# Patient Record
Sex: Female | Born: 1965
Health system: Southern US, Community
[De-identification: ages and names within clinical notes are randomized; demographics above are authoritative.]

---

## 1998-05-10 ENCOUNTER — Other Ambulatory Visit: Admission: RE | Admit: 1998-05-10 | Discharge: 1998-05-10 | Payer: Self-pay | Admitting: Gynecology

## 1998-06-17 ENCOUNTER — Ambulatory Visit: Admission: RE | Admit: 1998-06-17 | Discharge: 1998-06-17 | Payer: Self-pay | Admitting: Obstetrics and Gynecology

## 1999-08-04 ENCOUNTER — Inpatient Hospital Stay (HOSPITAL_COMMUNITY): Admission: AD | Admit: 1999-08-04 | Discharge: 1999-08-08 | Payer: Self-pay | Admitting: Obstetrics and Gynecology

## 1999-09-04 ENCOUNTER — Other Ambulatory Visit: Admission: RE | Admit: 1999-09-04 | Discharge: 1999-09-04 | Payer: Self-pay | Admitting: Obstetrics & Gynecology

## 2000-12-10 ENCOUNTER — Other Ambulatory Visit: Admission: RE | Admit: 2000-12-10 | Discharge: 2000-12-10 | Payer: Self-pay | Admitting: Obstetrics and Gynecology

## 2000-12-12 ENCOUNTER — Encounter: Payer: Self-pay | Admitting: Family Medicine

## 2000-12-12 ENCOUNTER — Ambulatory Visit (HOSPITAL_COMMUNITY): Admission: RE | Admit: 2000-12-12 | Discharge: 2000-12-12 | Payer: Self-pay | Admitting: Family Medicine

## 2001-01-24 ENCOUNTER — Encounter: Payer: Self-pay | Admitting: *Deleted

## 2001-01-24 ENCOUNTER — Ambulatory Visit (HOSPITAL_COMMUNITY): Admission: RE | Admit: 2001-01-24 | Discharge: 2001-01-24 | Payer: Self-pay | Admitting: *Deleted

## 2001-02-06 ENCOUNTER — Ambulatory Visit (HOSPITAL_COMMUNITY): Admission: RE | Admit: 2001-02-06 | Discharge: 2001-02-06 | Payer: Self-pay | Admitting: *Deleted

## 2001-02-19 ENCOUNTER — Encounter: Payer: Self-pay | Admitting: Surgery

## 2001-02-19 ENCOUNTER — Observation Stay (HOSPITAL_COMMUNITY): Admission: RE | Admit: 2001-02-19 | Discharge: 2001-02-20 | Payer: Self-pay | Admitting: Surgery

## 2002-08-14 ENCOUNTER — Other Ambulatory Visit: Admission: RE | Admit: 2002-08-14 | Discharge: 2002-08-14 | Payer: Self-pay | Admitting: Obstetrics and Gynecology

## 2003-01-21 ENCOUNTER — Ambulatory Visit (HOSPITAL_COMMUNITY): Admission: RE | Admit: 2003-01-21 | Discharge: 2003-01-21 | Payer: Self-pay | Admitting: *Deleted

## 2003-01-21 ENCOUNTER — Encounter: Payer: Self-pay | Admitting: *Deleted

## 2003-10-28 ENCOUNTER — Other Ambulatory Visit: Admission: RE | Admit: 2003-10-28 | Discharge: 2003-10-28 | Payer: Self-pay | Admitting: Obstetrics and Gynecology

## 2004-12-07 ENCOUNTER — Other Ambulatory Visit: Admission: RE | Admit: 2004-12-07 | Discharge: 2004-12-07 | Payer: Self-pay | Admitting: Obstetrics and Gynecology

## 2005-12-21 ENCOUNTER — Other Ambulatory Visit: Admission: RE | Admit: 2005-12-21 | Discharge: 2005-12-21 | Payer: Self-pay | Admitting: Obstetrics and Gynecology

## 2012-02-05 ENCOUNTER — Other Ambulatory Visit: Payer: Self-pay | Admitting: Radiology

## 2012-04-11 ENCOUNTER — Other Ambulatory Visit: Payer: Self-pay | Admitting: Obstetrics and Gynecology

## 2013-06-26 ENCOUNTER — Other Ambulatory Visit: Payer: Self-pay | Admitting: Obstetrics and Gynecology

## 2014-07-02 ENCOUNTER — Other Ambulatory Visit: Payer: Self-pay | Admitting: Obstetrics and Gynecology

## 2014-07-05 LAB — CYTOLOGY - PAP

## 2014-09-06 ENCOUNTER — Other Ambulatory Visit: Payer: Self-pay | Admitting: Gastroenterology

## 2015-02-08 MED ORDER — FENTANYL CITRATE (PF) 100 MCG/2ML IJ SOLN
INTRAMUSCULAR | Status: AC
  Filled 2015-02-08: qty 6

## 2015-02-08 MED ORDER — MIDAZOLAM HCL 2 MG/2ML IJ SOLN
INTRAMUSCULAR | Status: AC
  Filled 2015-02-08: qty 2

## 2015-03-02 ENCOUNTER — Ambulatory Visit (HOSPITAL_BASED_OUTPATIENT_CLINIC_OR_DEPARTMENT_OTHER): Admission: RE | Admit: 2015-03-02 | Payer: BLUE CROSS/BLUE SHIELD | Source: Ambulatory Visit | Admitting: Surgery

## 2015-03-02 ENCOUNTER — Encounter (HOSPITAL_BASED_OUTPATIENT_CLINIC_OR_DEPARTMENT_OTHER): Admission: RE | Payer: Self-pay | Source: Ambulatory Visit

## 2015-03-02 SURGERY — CYST REMOVAL
Anesthesia: Choice | Site: Groin | Laterality: Right

## 2015-12-02 ENCOUNTER — Other Ambulatory Visit: Payer: Self-pay | Admitting: Obstetrics and Gynecology

## 2015-12-05 LAB — CYTOLOGY - PAP

## 2016-02-14 DIAGNOSIS — F4323 Adjustment disorder with mixed anxiety and depressed mood: Secondary | ICD-10-CM | POA: Diagnosis not present

## 2016-02-19 DIAGNOSIS — S0501XA Injury of conjunctiva and corneal abrasion without foreign body, right eye, initial encounter: Secondary | ICD-10-CM | POA: Diagnosis not present

## 2016-03-23 DIAGNOSIS — D1801 Hemangioma of skin and subcutaneous tissue: Secondary | ICD-10-CM | POA: Diagnosis not present

## 2016-03-23 DIAGNOSIS — Z85828 Personal history of other malignant neoplasm of skin: Secondary | ICD-10-CM | POA: Diagnosis not present

## 2016-03-23 DIAGNOSIS — D2371 Other benign neoplasm of skin of right lower limb, including hip: Secondary | ICD-10-CM | POA: Diagnosis not present

## 2016-03-23 DIAGNOSIS — D485 Neoplasm of uncertain behavior of skin: Secondary | ICD-10-CM | POA: Diagnosis not present

## 2016-03-23 DIAGNOSIS — L814 Other melanin hyperpigmentation: Secondary | ICD-10-CM | POA: Diagnosis not present

## 2016-03-23 DIAGNOSIS — L821 Other seborrheic keratosis: Secondary | ICD-10-CM | POA: Diagnosis not present

## 2016-03-23 DIAGNOSIS — C44722 Squamous cell carcinoma of skin of right lower limb, including hip: Secondary | ICD-10-CM | POA: Diagnosis not present

## 2016-05-01 DIAGNOSIS — M7711 Lateral epicondylitis, right elbow: Secondary | ICD-10-CM | POA: Diagnosis not present

## 2016-05-01 DIAGNOSIS — Z13220 Encounter for screening for lipoid disorders: Secondary | ICD-10-CM | POA: Diagnosis not present

## 2016-05-01 DIAGNOSIS — L298 Other pruritus: Secondary | ICD-10-CM | POA: Diagnosis not present

## 2016-05-01 DIAGNOSIS — Z0000 Encounter for general adult medical examination without abnormal findings: Secondary | ICD-10-CM | POA: Diagnosis not present

## 2016-05-01 DIAGNOSIS — F5101 Primary insomnia: Secondary | ICD-10-CM | POA: Diagnosis not present

## 2016-05-29 DIAGNOSIS — S53401A Unspecified sprain of right elbow, initial encounter: Secondary | ICD-10-CM | POA: Diagnosis not present

## 2016-05-29 DIAGNOSIS — M7710 Lateral epicondylitis, unspecified elbow: Secondary | ICD-10-CM | POA: Diagnosis not present

## 2016-05-29 DIAGNOSIS — M7700 Medial epicondylitis, unspecified elbow: Secondary | ICD-10-CM | POA: Diagnosis not present

## 2016-05-30 DIAGNOSIS — L905 Scar conditions and fibrosis of skin: Secondary | ICD-10-CM | POA: Diagnosis not present

## 2016-05-30 DIAGNOSIS — C44722 Squamous cell carcinoma of skin of right lower limb, including hip: Secondary | ICD-10-CM | POA: Diagnosis not present

## 2016-06-20 DIAGNOSIS — M7711 Lateral epicondylitis, right elbow: Secondary | ICD-10-CM | POA: Diagnosis not present

## 2016-08-01 DIAGNOSIS — M7711 Lateral epicondylitis, right elbow: Secondary | ICD-10-CM | POA: Diagnosis not present

## 2016-09-19 DIAGNOSIS — Z803 Family history of malignant neoplasm of breast: Secondary | ICD-10-CM | POA: Diagnosis not present

## 2016-09-19 DIAGNOSIS — Z1231 Encounter for screening mammogram for malignant neoplasm of breast: Secondary | ICD-10-CM | POA: Diagnosis not present

## 2016-10-16 DIAGNOSIS — M7711 Lateral epicondylitis, right elbow: Secondary | ICD-10-CM | POA: Diagnosis not present

## 2016-10-25 DIAGNOSIS — M7711 Lateral epicondylitis, right elbow: Secondary | ICD-10-CM | POA: Diagnosis not present

## 2016-10-29 DIAGNOSIS — M7711 Lateral epicondylitis, right elbow: Secondary | ICD-10-CM | POA: Diagnosis not present

## 2016-11-01 DIAGNOSIS — M7711 Lateral epicondylitis, right elbow: Secondary | ICD-10-CM | POA: Diagnosis not present

## 2016-11-05 DIAGNOSIS — M7711 Lateral epicondylitis, right elbow: Secondary | ICD-10-CM | POA: Diagnosis not present

## 2016-11-09 DIAGNOSIS — M7711 Lateral epicondylitis, right elbow: Secondary | ICD-10-CM | POA: Diagnosis not present

## 2016-11-12 DIAGNOSIS — M7711 Lateral epicondylitis, right elbow: Secondary | ICD-10-CM | POA: Diagnosis not present

## 2016-11-19 DIAGNOSIS — M7711 Lateral epicondylitis, right elbow: Secondary | ICD-10-CM | POA: Diagnosis not present

## 2016-11-23 DIAGNOSIS — M7711 Lateral epicondylitis, right elbow: Secondary | ICD-10-CM | POA: Diagnosis not present

## 2016-11-26 DIAGNOSIS — M7711 Lateral epicondylitis, right elbow: Secondary | ICD-10-CM | POA: Diagnosis not present

## 2017-03-14 DIAGNOSIS — R87612 Low grade squamous intraepithelial lesion on cytologic smear of cervix (LGSIL): Secondary | ICD-10-CM | POA: Diagnosis not present

## 2017-03-14 DIAGNOSIS — Z01419 Encounter for gynecological examination (general) (routine) without abnormal findings: Secondary | ICD-10-CM | POA: Diagnosis not present

## 2017-03-14 DIAGNOSIS — Z124 Encounter for screening for malignant neoplasm of cervix: Secondary | ICD-10-CM | POA: Diagnosis not present

## 2017-03-14 DIAGNOSIS — Z6826 Body mass index (BMI) 26.0-26.9, adult: Secondary | ICD-10-CM | POA: Diagnosis not present

## 2017-04-03 DIAGNOSIS — L820 Inflamed seborrheic keratosis: Secondary | ICD-10-CM | POA: Diagnosis not present

## 2017-04-03 DIAGNOSIS — D1800 Hemangioma unspecified site: Secondary | ICD-10-CM | POA: Diagnosis not present

## 2017-04-03 DIAGNOSIS — L814 Other melanin hyperpigmentation: Secondary | ICD-10-CM | POA: Diagnosis not present

## 2017-04-03 DIAGNOSIS — D225 Melanocytic nevi of trunk: Secondary | ICD-10-CM | POA: Diagnosis not present

## 2017-04-03 DIAGNOSIS — L821 Other seborrheic keratosis: Secondary | ICD-10-CM | POA: Diagnosis not present

## 2017-04-15 DIAGNOSIS — R350 Frequency of micturition: Secondary | ICD-10-CM | POA: Diagnosis not present

## 2017-04-15 DIAGNOSIS — S91312A Laceration without foreign body, left foot, initial encounter: Secondary | ICD-10-CM | POA: Diagnosis not present

## 2017-04-19 DIAGNOSIS — M7711 Lateral epicondylitis, right elbow: Secondary | ICD-10-CM | POA: Diagnosis not present

## 2017-04-22 DIAGNOSIS — Z6826 Body mass index (BMI) 26.0-26.9, adult: Secondary | ICD-10-CM | POA: Diagnosis not present

## 2017-04-22 DIAGNOSIS — Z3202 Encounter for pregnancy test, result negative: Secondary | ICD-10-CM | POA: Diagnosis not present

## 2017-04-22 DIAGNOSIS — R87612 Low grade squamous intraepithelial lesion on cytologic smear of cervix (LGSIL): Secondary | ICD-10-CM | POA: Diagnosis not present

## 2017-04-23 DIAGNOSIS — S91312D Laceration without foreign body, left foot, subsequent encounter: Secondary | ICD-10-CM | POA: Diagnosis not present

## 2017-06-20 DIAGNOSIS — M7711 Lateral epicondylitis, right elbow: Secondary | ICD-10-CM | POA: Diagnosis not present

## 2017-07-04 DIAGNOSIS — M7711 Lateral epicondylitis, right elbow: Secondary | ICD-10-CM | POA: Diagnosis not present

## 2017-07-26 DIAGNOSIS — Z23 Encounter for immunization: Secondary | ICD-10-CM | POA: Diagnosis not present

## 2017-07-26 DIAGNOSIS — Z0000 Encounter for general adult medical examination without abnormal findings: Secondary | ICD-10-CM | POA: Diagnosis not present

## 2017-07-26 DIAGNOSIS — Z13220 Encounter for screening for lipoid disorders: Secondary | ICD-10-CM | POA: Diagnosis not present

## 2017-08-19 DIAGNOSIS — M7711 Lateral epicondylitis, right elbow: Secondary | ICD-10-CM | POA: Diagnosis not present

## 2017-09-06 ENCOUNTER — Ambulatory Visit (HOSPITAL_COMMUNITY)
Admission: EM | Admit: 2017-09-06 | Discharge: 2017-09-06 | Disposition: A | Discharge status: Home / Self Care | Payer: BLUE CROSS/BLUE SHIELD | Attending: Internal Medicine | Admitting: Internal Medicine

## 2017-09-06 ENCOUNTER — Encounter (HOSPITAL_COMMUNITY): Payer: Self-pay | Admitting: Emergency Medicine

## 2017-09-06 DIAGNOSIS — M545 Low back pain, unspecified: Secondary | ICD-10-CM

## 2017-09-06 DIAGNOSIS — T148XXA Other injury of unspecified body region, initial encounter: Secondary | ICD-10-CM

## 2017-09-06 DIAGNOSIS — H10212 Acute toxic conjunctivitis, left eye: Secondary | ICD-10-CM | POA: Diagnosis not present

## 2017-09-06 LAB — POCT URINALYSIS DIP (DEVICE)
Bilirubin Urine: NEGATIVE
Glucose, UA: NEGATIVE mg/dL
Ketones, ur: NEGATIVE mg/dL
Leukocytes, UA: NEGATIVE
Nitrite: NEGATIVE
Protein, ur: NEGATIVE mg/dL
Specific Gravity, Urine: 1.015 (ref 1.005–1.030)
Urobilinogen, UA: 0.2 mg/dL (ref 0.0–1.0)
pH: 6.5 (ref 5.0–8.0)

## 2017-09-06 MED ORDER — POLYMYXIN B-TRIMETHOPRIM 10000-0.1 UNIT/ML-% OP SOLN: 1.0000 [drp] | OPHTHALMIC | 0 refills | Status: AC

## 2017-09-06 NOTE — Discharge Instructions (Signed)
Obtain Zaditor and place 1 drop in left eye twice a day. Heat and stretches to back

## 2017-09-06 NOTE — ED Triage Notes (Signed)
PT C/O: lower left back pain   ONSET: 5 days  SX ALSO INCLUDE: pain increases w/activity , urinary urgency/fregquency   DENIES: fevers, dysuria,   TAKING MEDS: Increased water intake, Advil    PT C/O: also c/o left eye pain .... Feels like something is in it... Sx are intermittent   ONSET: 3-4 weeks  SX ALSO INCLUDE:   DENIES: blurred vision,   TAKING MEDS: none   A&O x4... NAD... Ambulatory

## 2017-09-06 NOTE — ED Provider Notes (Signed)
[facility]    CSN: [id number] Arrival date & time: 09/06/17  1116     History   Chief Complaint Chief Complaint  Patient presents with  . Back Pain  . Eye Pain    HPI Zack SealJudy S Kostick is a 51 y.o. female.   51 year old female complaining of left eye irritation and itching and watering off and on for several weeks. She does have thick mascara on her eyes. She is also complaining of pain in the left low back. Nothing makes it worse however when having the patient lean forward beyond 90 she feels pulled. No known injury.      History reviewed. No pertinent past medical history.  There are no active problems to display for this patient.   History reviewed. No pertinent surgical history.  OB History    No data available       Home Medications    Prior to Admission medications   Medication Sig Start Date End Date Taking? Authorizing Provider  trimethoprim-polymyxin b (POLYTRIM) ophthalmic solution Place 1 drop into the left eye every 4 (four) hours. 09/06/17   Hayden RasmussenMabe, Kdyn Vonbehren, NP    Family History History reviewed. No pertinent family history.  Social History Social History   Tobacco Use  . Smoking status: Never Smoker  . Smokeless tobacco: Never Used  Substance Use Topics  . Alcohol use: Yes  . Drug use: No     Allergies   Sulfa antibiotics   Review of Systems Review of Systems  Constitutional: Negative.  Negative for activity change, chills and fever.  HENT: Negative.   Eyes: Positive for discharge, redness and itching. Negative for visual disturbance.  Respiratory: Negative.   Cardiovascular: Negative.   Musculoskeletal: Positive for back pain.       As per HPI  Skin: Negative for color change, pallor and rash.  Neurological: Negative.   All other systems reviewed and are negative.    Physical Exam Triage Vital Signs ED Triage Vitals  Enc Vitals Group     BP 09/06/17 1158 128/70     Pulse Rate 09/06/17 1158 87     Resp 09/06/17  1158 20     Temp 09/06/17 1158 98.4 F (36.9 C)     Temp Source 09/06/17 1158 Oral     SpO2 09/06/17 1158 99 %     Weight --      Height --      Head Circumference --      Peak Flow --      Pain Score 09/06/17 1159 8     Pain Loc --      Pain Edu? --      Excl. in GC? --    No data found.  Updated Vital Signs BP 128/70 (BP Location: Left Arm)   Pulse 87   Temp 98.4 F (36.9 C) (Oral)   Resp 20   SpO2 99%   Visual Acuity Right Eye Distance:   Left Eye Distance:   Bilateral Distance:    Right Eye Near:   Left Eye Near:    Bilateral Near:     Physical Exam  Constitutional: She is oriented to person, place, and time. She appears well-developed and well-nourished. No distress.  HENT:  Head: Normocephalic and atraumatic.  Eyes: EOM are normal. Pupils are equal, round, and reactive to light.  Under magnification there is thick mascara fragmented at the base of the eyelashes with several particles lining the conjunctiva of the upper and lower lid.  Minor erythema of the upper and lower conjunctiva. Clear watery drainage. No foreign body over the sclera or cornea. Eye was irrigated copiously.  Neck: Normal range of motion. Neck supple.  Cardiovascular: Normal rate.  Pulmonary/Chest: Effort normal.  Musculoskeletal: Normal range of motion. She exhibits tenderness. She exhibits no edema or deformity.  Tenderness to the left paralumbar musculature. Leaning forward beyond 90 lateral patient to fill pulling in the same muscle. No spinal tenderness. No distal paresthesias or weakness. Normal gait.  Lymphadenopathy:    She has no cervical adenopathy.  Neurological: She is alert and oriented to person, place, and time. No cranial nerve deficit.  Skin: Skin is warm and dry.     UC Treatments / Results  Labs (all labs ordered are listed, but only abnormal results are displayed) Labs Reviewed  POCT URINALYSIS DIP (DEVICE) - Abnormal; Notable for the following components:       Result Value   Hgb urine dipstick TRACE (*)    All other components within normal limits    EKG  EKG Interpretation None       Radiology No results found.  Procedures Procedures (including critical care time)  Medications Ordered in UC Medications - No data to display   Initial Impression / Assessment and Plan / UC Course  I have reviewed the triage vital signs and the nursing notes.  Pertinent labs & imaging results that were available during my care of the patient were reviewed by me and considered in my medical decision making (see chart for details).    Obtain Zaditor and place 1 drop in left eye twice a day. Heat and stretches to back  Irrigated OS after anesthesia with tetracaine.   Final Clinical Impressions(s) / UC Diagnoses   Final diagnoses:  Chemical conjunctivitis of left eye  Acute left-sided low back pain without sciatica  Muscle strain    ED Discharge Orders        Ordered    trimethoprim-polymyxin b (POLYTRIM) ophthalmic solution  Every 4 hours     09/06/17 1300       Controlled Substance Prescriptions [place] Controlled Substance Registry consulted? Not Applicable   Hayden RasmussenMabe, Talal Fritchman, NP 09/06/17 1309

## 2017-11-04 DIAGNOSIS — Z1231 Encounter for screening mammogram for malignant neoplasm of breast: Secondary | ICD-10-CM | POA: Diagnosis not present

## 2017-11-29 DIAGNOSIS — N3000 Acute cystitis without hematuria: Secondary | ICD-10-CM | POA: Diagnosis not present

## 2018-01-23 DIAGNOSIS — F419 Anxiety disorder, unspecified: Secondary | ICD-10-CM | POA: Diagnosis not present

## 2018-01-23 DIAGNOSIS — A530 Latent syphilis, unspecified as early or late: Secondary | ICD-10-CM | POA: Diagnosis not present

## 2018-04-10 DIAGNOSIS — Z01419 Encounter for gynecological examination (general) (routine) without abnormal findings: Secondary | ICD-10-CM | POA: Diagnosis not present

## 2018-04-10 DIAGNOSIS — Z6822 Body mass index (BMI) 22.0-22.9, adult: Secondary | ICD-10-CM | POA: Diagnosis not present

## 2018-04-10 DIAGNOSIS — Z124 Encounter for screening for malignant neoplasm of cervix: Secondary | ICD-10-CM | POA: Diagnosis not present

## 2019-06-30 ENCOUNTER — Other Ambulatory Visit: Payer: Self-pay

## 2019-06-30 DIAGNOSIS — Z20822 Contact with and (suspected) exposure to covid-19: Secondary | ICD-10-CM

## 2019-07-02 LAB — NOVEL CORONAVIRUS, NAA: SARS-CoV-2, NAA: NOT DETECTED

## 2019-09-30 ENCOUNTER — Other Ambulatory Visit: Payer: Self-pay

## 2019-09-30 DIAGNOSIS — Z20822 Contact with and (suspected) exposure to covid-19: Secondary | ICD-10-CM

## 2019-10-02 LAB — NOVEL CORONAVIRUS, NAA: SARS-CoV-2, NAA: NOT DETECTED

## 2020-01-12 ENCOUNTER — Other Ambulatory Visit: Payer: Self-pay

## 2020-01-13 ENCOUNTER — Other Ambulatory Visit: Payer: Self-pay

## 2020-02-08 DIAGNOSIS — H16143 Punctate keratitis, bilateral: Secondary | ICD-10-CM | POA: Diagnosis not present

## 2020-02-10 DIAGNOSIS — R350 Frequency of micturition: Secondary | ICD-10-CM | POA: Diagnosis not present

## 2020-02-10 DIAGNOSIS — Z13220 Encounter for screening for lipoid disorders: Secondary | ICD-10-CM | POA: Diagnosis not present

## 2020-02-10 DIAGNOSIS — F5101 Primary insomnia: Secondary | ICD-10-CM | POA: Diagnosis not present

## 2020-02-10 DIAGNOSIS — Z0000 Encounter for general adult medical examination without abnormal findings: Secondary | ICD-10-CM | POA: Diagnosis not present

## 2020-02-10 DIAGNOSIS — N3090 Cystitis, unspecified without hematuria: Secondary | ICD-10-CM | POA: Diagnosis not present

## 2020-02-10 DIAGNOSIS — F419 Anxiety disorder, unspecified: Secondary | ICD-10-CM | POA: Diagnosis not present

## 2020-02-15 DIAGNOSIS — R87810 Cervical high risk human papillomavirus (HPV) DNA test positive: Secondary | ICD-10-CM | POA: Diagnosis not present

## 2020-02-15 DIAGNOSIS — Z6822 Body mass index (BMI) 22.0-22.9, adult: Secondary | ICD-10-CM | POA: Diagnosis not present

## 2020-02-15 DIAGNOSIS — Z01419 Encounter for gynecological examination (general) (routine) without abnormal findings: Secondary | ICD-10-CM | POA: Diagnosis not present

## 2020-02-15 DIAGNOSIS — Z124 Encounter for screening for malignant neoplasm of cervix: Secondary | ICD-10-CM | POA: Diagnosis not present

## 2020-02-15 DIAGNOSIS — R300 Dysuria: Secondary | ICD-10-CM | POA: Diagnosis not present

## 2020-03-01 DIAGNOSIS — Z1231 Encounter for screening mammogram for malignant neoplasm of breast: Secondary | ICD-10-CM | POA: Diagnosis not present

## 2020-03-09 DIAGNOSIS — R921 Mammographic calcification found on diagnostic imaging of breast: Secondary | ICD-10-CM | POA: Diagnosis not present

## 2020-03-17 DIAGNOSIS — B078 Other viral warts: Secondary | ICD-10-CM | POA: Diagnosis not present

## 2020-03-17 DIAGNOSIS — Z85828 Personal history of other malignant neoplasm of skin: Secondary | ICD-10-CM | POA: Diagnosis not present

## 2020-03-17 DIAGNOSIS — L820 Inflamed seborrheic keratosis: Secondary | ICD-10-CM | POA: Diagnosis not present

## 2020-03-17 DIAGNOSIS — C44622 Squamous cell carcinoma of skin of right upper limb, including shoulder: Secondary | ICD-10-CM | POA: Diagnosis not present

## 2020-03-17 DIAGNOSIS — L728 Other follicular cysts of the skin and subcutaneous tissue: Secondary | ICD-10-CM | POA: Diagnosis not present

## 2020-03-17 DIAGNOSIS — D485 Neoplasm of uncertain behavior of skin: Secondary | ICD-10-CM | POA: Diagnosis not present

## 2020-03-17 DIAGNOSIS — L570 Actinic keratosis: Secondary | ICD-10-CM | POA: Diagnosis not present

## 2020-03-17 DIAGNOSIS — L905 Scar conditions and fibrosis of skin: Secondary | ICD-10-CM | POA: Diagnosis not present

## 2020-03-17 DIAGNOSIS — D235 Other benign neoplasm of skin of trunk: Secondary | ICD-10-CM | POA: Diagnosis not present

## 2020-03-17 DIAGNOSIS — C44519 Basal cell carcinoma of skin of other part of trunk: Secondary | ICD-10-CM | POA: Diagnosis not present

## 2020-03-17 DIAGNOSIS — D225 Melanocytic nevi of trunk: Secondary | ICD-10-CM | POA: Diagnosis not present

## 2020-03-18 DIAGNOSIS — Z6822 Body mass index (BMI) 22.0-22.9, adult: Secondary | ICD-10-CM | POA: Diagnosis not present

## 2020-03-18 DIAGNOSIS — R87612 Low grade squamous intraepithelial lesion on cytologic smear of cervix (LGSIL): Secondary | ICD-10-CM | POA: Diagnosis not present

## 2020-03-21 DIAGNOSIS — R87612 Low grade squamous intraepithelial lesion on cytologic smear of cervix (LGSIL): Secondary | ICD-10-CM | POA: Diagnosis not present

## 2020-03-23 ENCOUNTER — Other Ambulatory Visit: Payer: Self-pay | Admitting: Radiology

## 2020-03-23 DIAGNOSIS — R921 Mammographic calcification found on diagnostic imaging of breast: Secondary | ICD-10-CM | POA: Diagnosis not present

## 2020-03-23 DIAGNOSIS — D241 Benign neoplasm of right breast: Secondary | ICD-10-CM | POA: Diagnosis not present

## 2020-03-30 DIAGNOSIS — L570 Actinic keratosis: Secondary | ICD-10-CM | POA: Diagnosis not present

## 2020-03-30 DIAGNOSIS — B078 Other viral warts: Secondary | ICD-10-CM | POA: Diagnosis not present

## 2020-03-30 DIAGNOSIS — D0461 Carcinoma in situ of skin of right upper limb, including shoulder: Secondary | ICD-10-CM | POA: Diagnosis not present

## 2020-04-08 DIAGNOSIS — L905 Scar conditions and fibrosis of skin: Secondary | ICD-10-CM | POA: Diagnosis not present

## 2020-04-08 DIAGNOSIS — C44519 Basal cell carcinoma of skin of other part of trunk: Secondary | ICD-10-CM | POA: Diagnosis not present

## 2020-05-11 DIAGNOSIS — Z1159 Encounter for screening for other viral diseases: Secondary | ICD-10-CM | POA: Diagnosis not present

## 2020-05-18 ENCOUNTER — Ambulatory Visit: Payer: BC Managed Care – PPO | Admitting: Podiatry

## 2020-05-18 ENCOUNTER — Other Ambulatory Visit: Payer: Self-pay

## 2020-05-18 DIAGNOSIS — B351 Tinea unguium: Secondary | ICD-10-CM | POA: Diagnosis not present

## 2020-05-18 DIAGNOSIS — L600 Ingrowing nail: Secondary | ICD-10-CM | POA: Diagnosis not present

## 2020-05-18 MED ORDER — TERBINAFINE HCL 250 MG PO TABS: 250.0000 mg | ORAL_TABLET | Freq: Every day | ORAL | 0 refills | Status: DC

## 2020-05-18 NOTE — Patient Instructions (Signed)

## 2020-05-23 ENCOUNTER — Telehealth (INDEPENDENT_AMBULATORY_CARE_PROVIDER_SITE_OTHER): Payer: BC Managed Care – PPO | Admitting: Podiatry

## 2020-05-23 ENCOUNTER — Telehealth: Payer: Self-pay | Admitting: Podiatry

## 2020-05-23 NOTE — Telephone Encounter (Signed)
Patient states pharmacy informed her she needs preauthorization for terbinafine 250 mg. Pharmacy is [facility] on [address].

## 2020-05-25 ENCOUNTER — Telehealth (INDEPENDENT_AMBULATORY_CARE_PROVIDER_SITE_OTHER): Payer: BC Managed Care – PPO | Admitting: Podiatry

## 2020-05-25 NOTE — Progress Notes (Signed)
   Subjective: Patient presents today for evaluation of pain to the medial border of the left great toe as well as the medial and lateral border of the right great toe. Patient is concerned for possible ingrown nail. Patient states that the pain has been present for approximately 1-2 months. Sudden onset. She denies any history of trauma. She states that her toes are very tender to touch. She does get pedicures routinely and she believes that it possibly developed from a pedicure she recently had. She has not done anything for treatment.  Patient has also noticed discoloration to her right great toenail. This developed after a routine pedicure. Patient states she has noticed discoloration with thickening of the nail plate. It is slowly gotten larger over time. She is concerned for possible toenail fungus. Patient presents today for further treatment and evaluation.  No past medical history on file.  Objective:  General: Well developed, nourished, in no acute distress, alert and oriented x3   Dermatology: Skin is warm, dry and supple bilateral. Medial border of the bilateral great toes as well as the lateral border of the right great toe appears to be erythematous with evidence of an ingrowing nail. Pain on palpation noted to the border of the nail fold. The remaining nails appear unremarkable at this time. There are no open sores, lesions.  Hyperkeratotic, discolored, dystrophic nail noted to the right hallux nail plate. Clinical findings consistent with onychomycosis  Vascular: Dorsalis Pedis artery and Posterior Tibial artery pedal pulses palpable. No lower extremity edema noted.   Neruologic: Grossly intact via light touch bilateral.  Musculoskeletal: Muscular strength within normal limits in all groups bilateral. Normal range of motion noted to all pedal and ankle joints.   Assesement: #1 Paronychia with ingrowing nail left hallux medial #2 Paronychia with ingrowing nail right hallux  medial and lateral #3 Onychomycosis of toenail right hallux  Plan of Care:  1. Patient evaluated.  2. Discussed treatment alternatives and plan of care. Explained nail avulsion procedure and post procedure course to patient. 3. Patient opted for temporary partial nail avulsion of the offending borders right hallux.  4. Prior to procedure, local anesthesia infiltration utilized using 3 ml of a 50:50 mixture of 2% plain lidocaine and 0.5% plain marcaine in a normal hallux block fashion and a betadine prep performed.  5. Partial temporary nail avulsion performed followed by alcohol flush.  6. Light dressing applied. 7. Prescription for Lamisil 250 mg #90 daily. Patient denies any hepatic pathology or liver symptoms. Patient otherwise healthy  Eight. Return to clinic 2 weeks to address the ingrown toenail on the left hallux medial border.  Felecia Shelling, DPM [facility]  Dr. Felecia Shelling, DPM    [address]                                        [place], [place] [address]                Office [phone]  Fax [phone]

## 2020-05-25 NOTE — Telephone Encounter (Signed)
Called to inform Ms. Howeth we are working on her authorization.

## 2020-05-26 NOTE — Telephone Encounter (Signed)
Encounter created in error

## 2020-06-01 ENCOUNTER — Other Ambulatory Visit: Payer: Self-pay

## 2020-06-01 ENCOUNTER — Ambulatory Visit: Payer: BC Managed Care – PPO | Admitting: Podiatry

## 2020-06-01 DIAGNOSIS — B351 Tinea unguium: Secondary | ICD-10-CM | POA: Diagnosis not present

## 2020-06-01 DIAGNOSIS — L600 Ingrowing nail: Secondary | ICD-10-CM

## 2020-06-01 MED ORDER — GENTAMICIN SULFATE 0.1 % EX CREA: 1.0000 "application " | TOPICAL_CREAM | Freq: Two times a day (BID) | CUTANEOUS | 1 refills | Status: AC

## 2020-06-01 MED ORDER — TERBINAFINE HCL 250 MG PO TABS: 250.0000 mg | ORAL_TABLET | Freq: Every day | ORAL | 0 refills | Status: AC

## 2020-06-01 NOTE — Progress Notes (Signed)
   Subjective: Patient presents today for evaluation of pain to the medial border left great toe. Patient is concerned for possible ingrown nail.  She has had an ingrown toenail off and on for quite some time to the left hallux medial border.  Last visit on 05/18/2020 the patient had temporary nail avulsion procedures performed to the medial lateral border of the right hallux nail plate.  She had a completely dystrophic nail with onychomycosis.  She was prescribed Lamisil 250 mg #90 however her insurance denied the prescription.  Patient presents today for further treatment and evaluation.  No past medical history on file.  Objective:  General: Well developed, nourished, in no acute distress, alert and oriented x3   Dermatology: Skin is warm, dry and supple bilateral.  Left hallux medial border appears to be erythematous with evidence of an ingrowing nail. Pain on palpation noted to the border of the nail fold. The remaining nails appear unremarkable at this time. There are no open sores, lesions.  The partial temporary nail avulsion sites to the right hallux nail plate appear healthy with good healing.  Vascular: Dorsalis Pedis artery and Posterior Tibial artery pedal pulses palpable. No lower extremity edema noted.   Neruologic: Grossly intact via light touch bilateral.  Musculoskeletal: Muscular strength within normal limits in all groups bilateral. Normal range of motion noted to all pedal and ankle joints.   Assesement: #1 Paronychia with ingrowing nail left hallux medial border #2 s/p partial temporary nail avulsions medial lateral border right great toe #3  Onychomycosis of bilateral great toenails  Plan of Care:  1. Patient evaluated.  2. Discussed treatment alternatives and plan of care. Explained nail avulsion procedure and post procedure course to patient. 3. Patient opted for permanent partial nail avulsion of the medial border left hallux.  4. Prior to procedure, local  anesthesia infiltration utilized using 3 ml of a 50:50 mixture of 2% plain lidocaine and 0.5% plain marcaine in a normal hallux block fashion and a betadine prep performed.  5. Partial permanent nail avulsion with chemical matrixectomy performed using 3x30sec applications of phenol followed by alcohol flush.  6. Light dressing applied. 7.  Today a prescription for Lamisil 250 mg #90 daily was prescribed and printed for the patient.  She denies any hepatic pathology or liver symptoms.  Patient otherwise healthy.  She wants to take the prescription to the pharmacy and bypass insurance approval for the medication.  The medication was denied because insurance requires nail biopsy prior to authorization of the prescription Eight.  Prescription for gentamicin cream Nine.  Return to clinic as needed  Felecia Shelling, DPM [facility]  Dr. Felecia Shelling, DPM    [address]                                        [place], [place] [address]                Office [phone]  Fax [phone]

## 2020-06-30 DIAGNOSIS — L820 Inflamed seborrheic keratosis: Secondary | ICD-10-CM | POA: Diagnosis not present

## 2020-06-30 DIAGNOSIS — L814 Other melanin hyperpigmentation: Secondary | ICD-10-CM | POA: Diagnosis not present

## 2020-06-30 DIAGNOSIS — L578 Other skin changes due to chronic exposure to nonionizing radiation: Secondary | ICD-10-CM | POA: Diagnosis not present

## 2020-06-30 DIAGNOSIS — L905 Scar conditions and fibrosis of skin: Secondary | ICD-10-CM | POA: Diagnosis not present

## 2020-06-30 DIAGNOSIS — Z85828 Personal history of other malignant neoplasm of skin: Secondary | ICD-10-CM | POA: Diagnosis not present

## 2020-06-30 DIAGNOSIS — L570 Actinic keratosis: Secondary | ICD-10-CM | POA: Diagnosis not present

## 2020-07-07 DIAGNOSIS — J069 Acute upper respiratory infection, unspecified: Secondary | ICD-10-CM | POA: Diagnosis not present

## 2020-07-07 DIAGNOSIS — Z20822 Contact with and (suspected) exposure to covid-19: Secondary | ICD-10-CM | POA: Diagnosis not present

## 2020-07-14 DIAGNOSIS — L570 Actinic keratosis: Secondary | ICD-10-CM | POA: Diagnosis not present

## 2020-08-30 DIAGNOSIS — Z1159 Encounter for screening for other viral diseases: Secondary | ICD-10-CM | POA: Diagnosis not present

## 2020-09-02 DIAGNOSIS — K5730 Diverticulosis of large intestine without perforation or abscess without bleeding: Secondary | ICD-10-CM | POA: Diagnosis not present

## 2020-09-02 DIAGNOSIS — Z86010 Personal history of colonic polyps: Secondary | ICD-10-CM | POA: Diagnosis not present

## 2020-09-02 DIAGNOSIS — D122 Benign neoplasm of ascending colon: Secondary | ICD-10-CM | POA: Diagnosis not present

## 2020-09-02 DIAGNOSIS — K649 Unspecified hemorrhoids: Secondary | ICD-10-CM | POA: Diagnosis not present

## 2020-09-02 DIAGNOSIS — D120 Benign neoplasm of cecum: Secondary | ICD-10-CM | POA: Diagnosis not present

## 2020-09-27 DIAGNOSIS — F419 Anxiety disorder, unspecified: Secondary | ICD-10-CM | POA: Diagnosis not present

## 2020-09-27 DIAGNOSIS — F5101 Primary insomnia: Secondary | ICD-10-CM | POA: Diagnosis not present

## 2020-09-27 DIAGNOSIS — R1031 Right lower quadrant pain: Secondary | ICD-10-CM | POA: Diagnosis not present

## 2020-09-27 DIAGNOSIS — M533 Sacrococcygeal disorders, not elsewhere classified: Secondary | ICD-10-CM | POA: Diagnosis not present

## 2020-09-30 DIAGNOSIS — Z20828 Contact with and (suspected) exposure to other viral communicable diseases: Secondary | ICD-10-CM | POA: Diagnosis not present

## 2020-10-18 DIAGNOSIS — R922 Inconclusive mammogram: Secondary | ICD-10-CM | POA: Diagnosis not present

## 2020-11-02 ENCOUNTER — Other Ambulatory Visit: Payer: Self-pay

## 2020-11-02 ENCOUNTER — Ambulatory Visit: Payer: BC Managed Care – PPO | Admitting: Podiatry

## 2020-11-02 ENCOUNTER — Ambulatory Visit: Payer: BC Managed Care – PPO

## 2020-11-02 ENCOUNTER — Encounter: Payer: Self-pay | Admitting: Podiatry

## 2020-11-02 ENCOUNTER — Telehealth: Payer: Self-pay | Admitting: Sports Medicine

## 2020-11-02 ENCOUNTER — Ambulatory Visit (INDEPENDENT_AMBULATORY_CARE_PROVIDER_SITE_OTHER): Payer: BC Managed Care – PPO

## 2020-11-02 DIAGNOSIS — S9031XA Contusion of right foot, initial encounter: Secondary | ICD-10-CM | POA: Diagnosis not present

## 2020-11-02 DIAGNOSIS — S92534A Nondisplaced fracture of distal phalanx of right lesser toe(s), initial encounter for closed fracture: Secondary | ICD-10-CM

## 2020-11-02 MED ORDER — HYDROCODONE-ACETAMINOPHEN 5-325 MG PO TABS
1.0000 | ORAL_TABLET | Freq: Four times a day (QID) | ORAL | 0 refills | Status: DC | PRN
Start: 2020-11-02 — End: 2020-11-09

## 2020-11-02 NOTE — Telephone Encounter (Signed)
Scheduled

## 2020-11-02 NOTE — Telephone Encounter (Signed)
Patient called answering service at 730 this morning reporting that on last night she bumped her foot on a table and she is in a lot of pain cannot put pressure on her foot think she may have broken the toe.  Advised patient to rest ice elevate and to take over-the-counter pain medicine and to call the office after 8 AM for an appointment to be seen today at our [facility] location.  Patient is a previous patient of Dr. Logan Bores. -Dr. Marylene Land

## 2020-11-09 ENCOUNTER — Other Ambulatory Visit: Payer: Self-pay

## 2020-11-09 ENCOUNTER — Ambulatory Visit (INDEPENDENT_AMBULATORY_CARE_PROVIDER_SITE_OTHER): Payer: BC Managed Care – PPO | Admitting: Podiatry

## 2020-11-09 ENCOUNTER — Ambulatory Visit (INDEPENDENT_AMBULATORY_CARE_PROVIDER_SITE_OTHER): Payer: BC Managed Care – PPO

## 2020-11-09 ENCOUNTER — Encounter: Payer: Self-pay | Admitting: Podiatry

## 2020-11-09 DIAGNOSIS — S9031XA Contusion of right foot, initial encounter: Secondary | ICD-10-CM | POA: Diagnosis not present

## 2020-11-09 DIAGNOSIS — S92534A Nondisplaced fracture of distal phalanx of right lesser toe(s), initial encounter for closed fracture: Secondary | ICD-10-CM

## 2020-11-09 MED ORDER — HYDROCODONE-ACETAMINOPHEN 5-325 MG PO TABS: 1.0000 | ORAL_TABLET | Freq: Four times a day (QID) | ORAL | 0 refills | Status: DC | PRN

## 2020-11-09 MED ORDER — MELOXICAM 15 MG PO TABS: 15.0000 mg | ORAL_TABLET | Freq: Every day | ORAL | 1 refills | Status: DC

## 2020-11-21 NOTE — Progress Notes (Signed)
   HPI: 55 y.o. female presenting today as an established patient for a new complaint regarding an injury to the right fifth toe.  Patient states that approximately 1 day ago she ran into a bench with no shoes on.  It is very painful now and she is unable to apply any pressure.  She is also noticed that the toe is somewhat crooked.  She presents for further treatment evaluation  No past medical history on file.   Physical Exam: General: The patient is alert and oriented x3 in no acute distress.  Dermatology: Skin is warm, dry and supple bilateral lower extremities. Negative for open lesions or macerations.  Vascular: Palpable pedal pulses bilaterally. No edema or erythema noted. Capillary refill within normal limits.  Neurological: Epicritic and protective threshold grossly intact bilaterally.   Musculoskeletal Exam: Range of motion within normal limits to all pedal and ankle joints bilateral. Muscle strength 5/5 in all groups bilateral.   Radiographic Exam: Prereduction: Fracture noted to the proximal phalanx of the right fifth toe with mild displacement and deviation of the toe.  No other fracture identified.  Joint spaces preserved.  Postreduction: Good realignment of the proximal phalanx of the toe.  Good alignment of the fifth digit.  Good reduction noted.   Assessment: 1.  Fracture right fifth toe proximal phalanx, displaced, closed   Plan of Care:  1. Patient evaluated.  2.  There was some deviation of the toe and the patient would like to have it reduced and put in good alignment.  Decision was made to reduce the fracture of the toe.  Prior to reduction the toe was blocked using 2% lidocaine plain totaling 3 mL.  The toe was distracted and reduced with good realignment.  Post reduction x-rays were taken demonstrating good apposition of the fracture and good alignment of the toe.  Splinting was applied with instructions to keep clean dry and intact x1 week 3.  Postoperative shoe  dispensed.  Minimal weightbearing as tolerated 4.  Prescription for Vicodin 5/325 mg 5.  Return to clinic in 1 week for follow-up x-ray      Felecia Shelling, DPM [facility]  Dr. Felecia Shelling, DPM    [address] [place], [place] [address]                Office [phone]  Fax [phone]

## 2020-11-26 NOTE — Progress Notes (Signed)
   HPI: 55 y.o. female presenting today for follow-up evaluation of closed reduction of a fracture to the fifth toe.  Overall the patient continues to have pain and tenderness.  She has been weightbearing in the surgical shoe and she is left the dressings clean dry and intact for the past week.  She presents for further treatment and evaluation  No past medical history on file.   Physical Exam: General: The patient is alert and oriented x3 in no acute distress.  Dermatology: Skin is warm, dry and supple bilateral lower extremities. Negative for open lesions or macerations.  Vascular: Palpable pedal pulses bilaterally.  There continues to be some ecchymosis with edema around the fracture site.  Capillary refill within normal limits.  Neurological: Epicritic and protective threshold grossly intact bilaterally.   Musculoskeletal Exam: Overall the toe is in good alignment and in a good anatomical position.  No significant change since last visit post reduction  Radiographic Exam:  No change since postreduction x-rays were taken last visit.  It appears that the fracture and the toe has not moved and is stable  Assessment: 1.  Fifth toe fracture post reduction 1 week   Plan of Care:  1. Patient evaluated. X-Rays reviewed.  2.  Continue wearing the postsurgical shoe x4 weeks 3.  A note for work was provided today 4.  Refill prescription for Vicodin 5/3 2 5  mg 5.  Prescription for meloxicam 15 mg daily 6.  Return to clinic in 4 weeks for follow-up x-ray      Felecia Shelling, DPM [facility]  Dr. Felecia Shelling, DPM    [address] [place], [place] [address]                Office [phone]  Fax [phone]

## 2020-12-07 ENCOUNTER — Ambulatory Visit: Payer: BC Managed Care – PPO | Admitting: Podiatry

## 2020-12-12 ENCOUNTER — Ambulatory Visit (INDEPENDENT_AMBULATORY_CARE_PROVIDER_SITE_OTHER): Payer: BC Managed Care – PPO | Admitting: Podiatry

## 2020-12-12 ENCOUNTER — Ambulatory Visit (INDEPENDENT_AMBULATORY_CARE_PROVIDER_SITE_OTHER): Payer: BC Managed Care – PPO

## 2020-12-12 ENCOUNTER — Other Ambulatory Visit: Payer: Self-pay

## 2020-12-12 DIAGNOSIS — S9031XA Contusion of right foot, initial encounter: Secondary | ICD-10-CM | POA: Diagnosis not present

## 2020-12-12 DIAGNOSIS — S92534A Nondisplaced fracture of distal phalanx of right lesser toe(s), initial encounter for closed fracture: Secondary | ICD-10-CM

## 2020-12-12 NOTE — Progress Notes (Signed)
   HPI: 55 y.o. female presenting today for follow-up evaluation of closed reduction of a fracture to the fifth toe.  Patient continues to have some intermittent achiness however there is some improvement over the last month.  She presents for follow-up treatment and evaluation.  No new complaints at this time.  She has been wearing the postsurgical shoe as instructed  No past medical history on file.   Physical Exam: General: The patient is alert and oriented x3 in no acute distress.  Dermatology: Skin is warm, dry and supple bilateral lower extremities. Negative for open lesions or macerations.  Vascular: Palpable pedal pulses bilaterally.  There continues to be some ecchymosis with edema around the fracture site.  Capillary refill within normal limits.  Neurological: Epicritic and protective threshold grossly intact bilaterally.   Musculoskeletal Exam: Overall the toe is in good alignment and in a good anatomical position.  No significant change since last visit post reduction  Radiographic Exam:  Good alignment of the proximal phalanx of the fifth toe and fifth ray in general.  Routine healing noted.  Assessment: 1.  Fracture fifth toe left foot, closed, nondisplaced, proximal phalanx with routine healing   Plan of Care:  1. Patient evaluated. X-Rays reviewed.  2.  Patient may begin to transition out of the postsurgical shoe.  Recommend good supportive sneakers 3.  Patient may slowly increase her activity to full activity no restrictions of the next 2 weeks 4.  Return to clinic as needed     Felecia Shelling, DPM [facility]  Dr. Felecia Shelling, DPM    [address] [place], [place] [address]                Office [phone]  Fax [phone]

## 2021-03-07 DIAGNOSIS — Z1231 Encounter for screening mammogram for malignant neoplasm of breast: Secondary | ICD-10-CM | POA: Diagnosis not present

## 2021-03-08 DIAGNOSIS — L570 Actinic keratosis: Secondary | ICD-10-CM | POA: Diagnosis not present

## 2021-03-28 DIAGNOSIS — Z01419 Encounter for gynecological examination (general) (routine) without abnormal findings: Secondary | ICD-10-CM | POA: Diagnosis not present

## 2021-03-28 DIAGNOSIS — R87810 Cervical high risk human papillomavirus (HPV) DNA test positive: Secondary | ICD-10-CM | POA: Diagnosis not present

## 2021-03-28 DIAGNOSIS — Z6824 Body mass index (BMI) 24.0-24.9, adult: Secondary | ICD-10-CM | POA: Diagnosis not present

## 2021-03-28 DIAGNOSIS — R87612 Low grade squamous intraepithelial lesion on cytologic smear of cervix (LGSIL): Secondary | ICD-10-CM | POA: Diagnosis not present

## 2021-03-28 DIAGNOSIS — Z124 Encounter for screening for malignant neoplasm of cervix: Secondary | ICD-10-CM | POA: Diagnosis not present

## 2021-04-11 ENCOUNTER — Other Ambulatory Visit: Payer: Self-pay

## 2021-04-11 ENCOUNTER — Encounter: Payer: Self-pay | Admitting: *Deleted

## 2021-04-11 ENCOUNTER — Ambulatory Visit (INDEPENDENT_AMBULATORY_CARE_PROVIDER_SITE_OTHER): Payer: BC Managed Care – PPO

## 2021-04-11 ENCOUNTER — Ambulatory Visit: Payer: BC Managed Care – PPO | Admitting: Podiatry

## 2021-04-11 DIAGNOSIS — S93491A Sprain of other ligament of right ankle, initial encounter: Secondary | ICD-10-CM

## 2021-04-11 DIAGNOSIS — S93601A Unspecified sprain of right foot, initial encounter: Secondary | ICD-10-CM

## 2021-04-11 DIAGNOSIS — S92534A Nondisplaced fracture of distal phalanx of right lesser toe(s), initial encounter for closed fracture: Secondary | ICD-10-CM | POA: Diagnosis not present

## 2021-04-11 NOTE — Progress Notes (Signed)
   HPI: 55 y.o. female presenting today for new complaint regarding an injury that the patient sustained to the right foot yesterday, 04/10/2021.  Patient states that she was stepping out of the back of her pickup truck when she slipped and she heard a crack when she fell on her right foot.  She does have a history of a toe fracture to the right fifth toe.  She is afraid that she reinjured her right fifth toe.  She also has pain and swelling throughout the entire foot and lateral aspect of the right ankle.  She states that her right ankle rolled in.  She presents for further treatment and evaluation  No past medical history on file.   Physical Exam: General: The patient is alert and oriented x3 in no acute distress.  Dermatology: Skin is warm, dry and supple bilateral lower extremities. Negative for open lesions or macerations.  Vascular: Palpable pedal pulses bilaterally.  Ecchymosis with edema noted to the lateral aspect of the right ankle and foot.  Edema also noted localized to the right fifth toe.  Neurological: Epicritic and protective threshold grossly intact bilaterally.   Musculoskeletal Exam: Associated tenderness to palpation lateral aspect of the right ankle and foot.  There is also pain on palpation to the right fifth toe.  Radiographic Exam:  Normal osseous mineralization. Joint spaces preserved. No fracture/dislocation/boney destruction.   Chronic appearing fracture of the proximal phalanx of the right fifth toe noted.  No other fractures identified on foot or ankle x-rays  Assessment: 1.  Right foot/ankle sprain.  DOI: 04/10/2021 2.  Reinjury of a right fifth toe fracture   Plan of Care:  1. Patient evaluated. X-Rays reviewed.  2.  Cam boot dispensed.  Weightbearing as tolerated 3.  Recommend that the patient works from home x1 week 4.  Recommend rest ice compression and elevation daily 5.  Prescription for meloxicam 15 mg daily 6.  Return to clinic in 4 weeks       Felecia Shelling, DPM [facility]  Dr. Felecia Shelling, DPM    [address] [place], [place] [address]                Office [phone]  Fax [phone]

## 2021-04-12 ENCOUNTER — Telehealth: Payer: Self-pay | Admitting: Podiatry

## 2021-04-12 ENCOUNTER — Telehealth: Payer: Self-pay | Admitting: *Deleted

## 2021-04-12 NOTE — Telephone Encounter (Signed)
Patient is calling for the status of a pain and anti inflammatory medication was supposed to be sent to pharmacy on file. Please advise.

## 2021-04-12 NOTE — Telephone Encounter (Signed)
Patient was seen in [place] yesterday by Dr. Logan Bores. She was suppose to receive anti-inflammatory medication and pain meds to her pharmacy.   Please advise

## 2021-04-13 ENCOUNTER — Telehealth: Payer: Self-pay | Admitting: *Deleted

## 2021-04-13 ENCOUNTER — Other Ambulatory Visit: Payer: Self-pay | Admitting: Podiatry

## 2021-04-13 MED ORDER — MELOXICAM 15 MG PO TABS: 15.0000 mg | ORAL_TABLET | Freq: Every day | ORAL | 1 refills | Status: AC

## 2021-04-13 MED ORDER — HYDROCODONE-ACETAMINOPHEN 5-325 MG PO TABS: 1.0000 | ORAL_TABLET | Freq: Four times a day (QID) | ORAL | 0 refills | Status: AC | PRN

## 2021-04-13 NOTE — Telephone Encounter (Signed)
Sent. Please let patient know. Sorry for the delay. - Dr. Logan Bores

## 2021-04-13 NOTE — Telephone Encounter (Signed)
Informed patient that medications have been sent to pharmacy on file.

## 2021-04-13 NOTE — Telephone Encounter (Signed)
Patient calling to check the status of medication. Please send in prescription medication to [facility].

## 2021-04-13 NOTE — Telephone Encounter (Signed)
Both prescriptions sent. Please let patient know. I apologize for the delay.  - Dr. Logan Bores

## 2021-04-14 ENCOUNTER — Ambulatory Visit: Payer: BC Managed Care – PPO | Admitting: Podiatry

## 2021-04-18 ENCOUNTER — Ambulatory Visit: Payer: BC Managed Care – PPO | Admitting: Podiatry

## 2021-05-05 DIAGNOSIS — L570 Actinic keratosis: Secondary | ICD-10-CM | POA: Diagnosis not present

## 2021-05-05 DIAGNOSIS — Z85828 Personal history of other malignant neoplasm of skin: Secondary | ICD-10-CM | POA: Diagnosis not present

## 2021-05-05 DIAGNOSIS — L905 Scar conditions and fibrosis of skin: Secondary | ICD-10-CM | POA: Diagnosis not present

## 2021-05-05 DIAGNOSIS — L853 Xerosis cutis: Secondary | ICD-10-CM | POA: Diagnosis not present

## 2021-05-05 DIAGNOSIS — B078 Other viral warts: Secondary | ICD-10-CM | POA: Diagnosis not present

## 2021-05-12 ENCOUNTER — Ambulatory Visit: Payer: BC Managed Care – PPO | Admitting: Podiatry

## 2021-05-17 ENCOUNTER — Other Ambulatory Visit: Payer: Self-pay

## 2021-05-17 ENCOUNTER — Ambulatory Visit: Payer: BC Managed Care – PPO | Admitting: Podiatry

## 2021-05-17 ENCOUNTER — Ambulatory Visit (INDEPENDENT_AMBULATORY_CARE_PROVIDER_SITE_OTHER): Payer: BC Managed Care – PPO

## 2021-05-17 DIAGNOSIS — S92534A Nondisplaced fracture of distal phalanx of right lesser toe(s), initial encounter for closed fracture: Secondary | ICD-10-CM

## 2021-05-17 DIAGNOSIS — S93601D Unspecified sprain of right foot, subsequent encounter: Secondary | ICD-10-CM

## 2021-05-17 NOTE — Progress Notes (Signed)
   HPI: 55 y.o. female presenting today for new complaint regarding an injury that the patient sustained to the right foot yesterday, 04/10/2021.  Patient states that she was stepping out of the back of her pickup truck when she slipped and she heard a crack when she fell on her right foot.  She does have a history of a toe fracture to the right fifth toe.    Patient states that she is doing much better.  She has been weightbearing in the cam boot as instructed.  No new complaints at this time.  No past medical history on file.   Physical Exam: General: The patient is alert and oriented x3 in no acute distress.  Dermatology: Skin is warm, dry and supple bilateral lower extremities. Negative for open lesions or macerations.  Vascular: Palpable pedal pulses bilaterally.  Ecchymosis with edema noted to the lateral aspect of the right ankle and foot.  Edema also noted localized to the right fifth toe.  Neurological: Epicritic and protective threshold grossly intact bilaterally.   Musculoskeletal Exam: Associated tenderness to palpation lateral aspect of the right ankle and foot.  Negative for palpation to the right fifth toe.  Radiographic Exam:  Normal osseous mineralization. Joint spaces preserved. No fracture/dislocation/boney destruction.   Chronic appearing fracture of the proximal phalanx of the right fifth toe noted.  Good routine healing noted no other fractures identified on foot or ankle x-rays  Assessment: 1.  Right foot/ankle sprain.  DOI: 04/10/2021 2.  Reinjury of a right fifth toe fracture with routine healing   Plan of Care:  1. Patient evaluated. X-Rays reviewed.  2.  Patient may discontinue cam boot.  Ankle brace dispensed.  Weightbearing as tolerated x1 month 3.  Patient may slowly increase activity full activity no restrictions 4.  The right fifth toe demonstrates good healing.  Good alignment of the toe.  Explained to the patient that the toe may be slightly swollen for  a few additional months while healing occurs of the toe fracture 5.  Return to clinic as needed      Felecia Shelling, DPM [facility]  Dr. Felecia Shelling, DPM    [address] [place], [place] [address]                Office [phone]  Fax [phone]

## 2021-05-22 DIAGNOSIS — M542 Cervicalgia: Secondary | ICD-10-CM | POA: Diagnosis not present

## 2021-05-22 DIAGNOSIS — M79645 Pain in left finger(s): Secondary | ICD-10-CM | POA: Diagnosis not present

## 2021-05-22 DIAGNOSIS — R519 Headache, unspecified: Secondary | ICD-10-CM | POA: Diagnosis not present

## 2021-05-22 DIAGNOSIS — S46812A Strain of other muscles, fascia and tendons at shoulder and upper arm level, left arm, initial encounter: Secondary | ICD-10-CM | POA: Diagnosis not present

## 2021-05-30 DIAGNOSIS — M542 Cervicalgia: Secondary | ICD-10-CM | POA: Diagnosis not present

## 2021-05-30 DIAGNOSIS — M4722 Other spondylosis with radiculopathy, cervical region: Secondary | ICD-10-CM | POA: Diagnosis not present

## 2021-06-08 DIAGNOSIS — Z13220 Encounter for screening for lipoid disorders: Secondary | ICD-10-CM | POA: Diagnosis not present

## 2021-06-08 DIAGNOSIS — Z0000 Encounter for general adult medical examination without abnormal findings: Secondary | ICD-10-CM | POA: Diagnosis not present

## 2021-06-08 DIAGNOSIS — M5412 Radiculopathy, cervical region: Secondary | ICD-10-CM | POA: Diagnosis not present

## 2021-06-08 DIAGNOSIS — Z23 Encounter for immunization: Secondary | ICD-10-CM | POA: Diagnosis not present

## 2021-06-12 DIAGNOSIS — M5412 Radiculopathy, cervical region: Secondary | ICD-10-CM | POA: Diagnosis not present

## 2021-06-19 DIAGNOSIS — M5412 Radiculopathy, cervical region: Secondary | ICD-10-CM | POA: Diagnosis not present

## 2021-06-26 DIAGNOSIS — M5412 Radiculopathy, cervical region: Secondary | ICD-10-CM | POA: Diagnosis not present

## 2021-07-05 DIAGNOSIS — M5412 Radiculopathy, cervical region: Secondary | ICD-10-CM | POA: Diagnosis not present

## 2021-09-07 DIAGNOSIS — Z23 Encounter for immunization: Secondary | ICD-10-CM | POA: Diagnosis not present

## 2021-09-16 DIAGNOSIS — R110 Nausea: Secondary | ICD-10-CM | POA: Diagnosis not present

## 2021-09-16 DIAGNOSIS — R1030 Lower abdominal pain, unspecified: Secondary | ICD-10-CM | POA: Diagnosis not present

## 2021-09-22 DIAGNOSIS — R1030 Lower abdominal pain, unspecified: Secondary | ICD-10-CM | POA: Diagnosis not present

## 2021-10-06 DIAGNOSIS — M79645 Pain in left finger(s): Secondary | ICD-10-CM | POA: Diagnosis not present

## 2021-10-07 ENCOUNTER — Emergency Department (HOSPITAL_BASED_OUTPATIENT_CLINIC_OR_DEPARTMENT_OTHER)
Admission: EM | Admit: 2021-10-07 | Discharge: 2021-10-07 | Disposition: A | Discharge status: Home / Self Care | Payer: BC Managed Care – PPO | Attending: Emergency Medicine | Admitting: Emergency Medicine

## 2021-10-07 ENCOUNTER — Other Ambulatory Visit: Payer: Self-pay

## 2021-10-07 ENCOUNTER — Encounter (HOSPITAL_BASED_OUTPATIENT_CLINIC_OR_DEPARTMENT_OTHER): Payer: Self-pay | Admitting: Emergency Medicine

## 2021-10-07 ENCOUNTER — Emergency Department (HOSPITAL_BASED_OUTPATIENT_CLINIC_OR_DEPARTMENT_OTHER): Payer: BC Managed Care – PPO

## 2021-10-07 DIAGNOSIS — R079 Chest pain, unspecified: Secondary | ICD-10-CM | POA: Diagnosis not present

## 2021-10-07 DIAGNOSIS — Y92481 Parking lot as the place of occurrence of the external cause: Secondary | ICD-10-CM | POA: Diagnosis not present

## 2021-10-07 DIAGNOSIS — M542 Cervicalgia: Secondary | ICD-10-CM | POA: Insufficient documentation

## 2021-10-07 DIAGNOSIS — R0789 Other chest pain: Secondary | ICD-10-CM | POA: Diagnosis not present

## 2021-10-07 DIAGNOSIS — M79632 Pain in left forearm: Secondary | ICD-10-CM | POA: Diagnosis not present

## 2021-10-07 DIAGNOSIS — R519 Headache, unspecified: Secondary | ICD-10-CM | POA: Insufficient documentation

## 2021-10-07 DIAGNOSIS — R110 Nausea: Secondary | ICD-10-CM | POA: Insufficient documentation

## 2021-10-07 LAB — CBC WITH DIFFERENTIAL/PLATELET
Abs Immature Granulocytes: 0.04 10*3/uL (ref 0.00–0.07)
Basophils Absolute: 0 10*3/uL (ref 0.0–0.1)
Basophils Relative: 1 %
Eosinophils Absolute: 0.1 10*3/uL (ref 0.0–0.5)
Eosinophils Relative: 2 %
HCT: 44.8 % (ref 36.0–46.0)
Hemoglobin: 14.5 g/dL (ref 12.0–15.0)
Immature Granulocytes: 1 %
Lymphocytes Relative: 36 %
Lymphs Abs: 1.6 10*3/uL (ref 0.7–4.0)
MCH: 29.1 pg (ref 26.0–34.0)
MCHC: 32.4 g/dL (ref 30.0–36.0)
MCV: 90 fL (ref 80.0–100.0)
Monocytes Absolute: 0.4 10*3/uL (ref 0.1–1.0)
Monocytes Relative: 10 %
Neutro Abs: 2.3 10*3/uL (ref 1.7–7.7)
Neutrophils Relative %: 50 %
Platelets: 165 10*3/uL (ref 150–400)
RBC: 4.98 MIL/uL (ref 3.87–5.11)
RDW: 12.6 % (ref 11.5–15.5)
WBC: 4.4 10*3/uL (ref 4.0–10.5)
nRBC: 0 % (ref 0.0–0.2)

## 2021-10-07 LAB — BASIC METABOLIC PANEL
Anion gap: 8 (ref 5–15)
BUN: 15 mg/dL (ref 6–20)
CO2: 26 mmol/L (ref 22–32)
Calcium: 9.9 mg/dL (ref 8.9–10.3)
Chloride: 104 mmol/L (ref 98–111)
Creatinine, Ser: 0.64 mg/dL (ref 0.44–1.00)
GFR, Estimated: >60 mL/min (ref >60)
Glucose, Bld: 88 mg/dL (ref 70–99)
Potassium: 4.3 mmol/L (ref 3.5–5.1)
Sodium: 138 mmol/L (ref 135–145)

## 2021-10-07 MED ORDER — IOHEXOL 300 MG/ML  SOLN
100.0000 mL | Freq: Once | INTRAMUSCULAR | Status: AC | PRN
  Administered 2021-10-07: 75 mL via INTRAVENOUS

## 2021-10-07 MED ORDER — NAPROXEN 500 MG PO TABS
500.0000 mg | ORAL_TABLET | Freq: Two times a day (BID) | ORAL | 0 refills | Status: AC
Start: 2021-10-07 — End: 2021-10-14

## 2021-10-07 MED ORDER — METHOCARBAMOL 500 MG PO TABS: 500.0000 mg | ORAL_TABLET | Freq: Two times a day (BID) | ORAL | 0 refills | Status: AC

## 2021-10-07 MED ORDER — ACETAMINOPHEN 325 MG PO TABS
650.0000 mg | ORAL_TABLET | Freq: Once | ORAL | Status: AC
Start: 2021-10-07 — End: 2021-10-07
  Administered 2021-10-07: 650 mg via ORAL
  Filled 2021-10-07: qty 2

## 2021-10-07 NOTE — Discharge Instructions (Signed)
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
## 2021-10-10 DIAGNOSIS — M67449 Ganglion, unspecified hand: Secondary | ICD-10-CM | POA: Diagnosis not present

## 2021-11-03 ENCOUNTER — Other Ambulatory Visit: Payer: Self-pay

## 2021-11-03 DIAGNOSIS — D481 Neoplasm of uncertain behavior of connective and other soft tissue: Secondary | ICD-10-CM | POA: Diagnosis not present

## 2021-11-03 DIAGNOSIS — M67442 Ganglion, left hand: Secondary | ICD-10-CM | POA: Diagnosis not present

## 2021-11-03 DIAGNOSIS — M71342 Other bursal cyst, left hand: Secondary | ICD-10-CM | POA: Diagnosis not present

## 2021-11-24 DIAGNOSIS — M79671 Pain in right foot: Secondary | ICD-10-CM | POA: Diagnosis not present

## 2022-02-18 DIAGNOSIS — S83411A Sprain of medial collateral ligament of right knee, initial encounter: Secondary | ICD-10-CM | POA: Diagnosis not present

## 2022-03-02 DIAGNOSIS — S8391XA Sprain of unspecified site of right knee, initial encounter: Secondary | ICD-10-CM | POA: Diagnosis not present

## 2022-03-23 DIAGNOSIS — Z1231 Encounter for screening mammogram for malignant neoplasm of breast: Secondary | ICD-10-CM | POA: Diagnosis not present

## 2022-04-06 DIAGNOSIS — M25561 Pain in right knee: Secondary | ICD-10-CM | POA: Diagnosis not present

## 2022-04-10 DIAGNOSIS — S8392XA Sprain of unspecified site of left knee, initial encounter: Secondary | ICD-10-CM | POA: Diagnosis not present

## 2022-04-18 DIAGNOSIS — R531 Weakness: Secondary | ICD-10-CM | POA: Diagnosis not present

## 2022-04-18 DIAGNOSIS — R269 Unspecified abnormalities of gait and mobility: Secondary | ICD-10-CM | POA: Diagnosis not present

## 2022-04-18 DIAGNOSIS — M25561 Pain in right knee: Secondary | ICD-10-CM | POA: Diagnosis not present

## 2022-04-23 DIAGNOSIS — M25561 Pain in right knee: Secondary | ICD-10-CM | POA: Diagnosis not present

## 2022-04-23 DIAGNOSIS — R531 Weakness: Secondary | ICD-10-CM | POA: Diagnosis not present

## 2022-04-23 DIAGNOSIS — R269 Unspecified abnormalities of gait and mobility: Secondary | ICD-10-CM | POA: Diagnosis not present

## 2022-04-30 DIAGNOSIS — R531 Weakness: Secondary | ICD-10-CM | POA: Diagnosis not present

## 2022-04-30 DIAGNOSIS — M25561 Pain in right knee: Secondary | ICD-10-CM | POA: Diagnosis not present

## 2022-04-30 DIAGNOSIS — R269 Unspecified abnormalities of gait and mobility: Secondary | ICD-10-CM | POA: Diagnosis not present

## 2022-05-07 DIAGNOSIS — M25561 Pain in right knee: Secondary | ICD-10-CM | POA: Diagnosis not present

## 2022-05-07 DIAGNOSIS — R531 Weakness: Secondary | ICD-10-CM | POA: Diagnosis not present

## 2022-05-07 DIAGNOSIS — R269 Unspecified abnormalities of gait and mobility: Secondary | ICD-10-CM | POA: Diagnosis not present

## 2022-05-14 DIAGNOSIS — M25561 Pain in right knee: Secondary | ICD-10-CM | POA: Diagnosis not present

## 2022-05-14 DIAGNOSIS — R269 Unspecified abnormalities of gait and mobility: Secondary | ICD-10-CM | POA: Diagnosis not present

## 2022-05-14 DIAGNOSIS — R531 Weakness: Secondary | ICD-10-CM | POA: Diagnosis not present

## 2022-06-08 DIAGNOSIS — R269 Unspecified abnormalities of gait and mobility: Secondary | ICD-10-CM | POA: Diagnosis not present

## 2022-06-08 DIAGNOSIS — M25561 Pain in right knee: Secondary | ICD-10-CM | POA: Diagnosis not present

## 2022-06-08 DIAGNOSIS — R531 Weakness: Secondary | ICD-10-CM | POA: Diagnosis not present

## 2022-06-14 DIAGNOSIS — M67442 Ganglion, left hand: Secondary | ICD-10-CM | POA: Diagnosis not present

## 2022-06-14 DIAGNOSIS — M7051 Other bursitis of knee, right knee: Secondary | ICD-10-CM | POA: Diagnosis not present
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
## 2022-06-25 DIAGNOSIS — M25561 Pain in right knee: Secondary | ICD-10-CM | POA: Diagnosis not present

## 2022-06-25 DIAGNOSIS — R531 Weakness: Secondary | ICD-10-CM | POA: Diagnosis not present

## 2022-06-25 DIAGNOSIS — R269 Unspecified abnormalities of gait and mobility: Secondary | ICD-10-CM | POA: Diagnosis not present

## 2022-06-26 DIAGNOSIS — M26601 Right temporomandibular joint disorder, unspecified: Secondary | ICD-10-CM | POA: Diagnosis not present

## 2022-07-12 DIAGNOSIS — M79645 Pain in left finger(s): Secondary | ICD-10-CM | POA: Diagnosis not present

## 2022-07-27 DIAGNOSIS — Z8742 Personal history of other diseases of the female genital tract: Secondary | ICD-10-CM | POA: Diagnosis not present

## 2022-07-27 DIAGNOSIS — R87610 Atypical squamous cells of undetermined significance on cytologic smear of cervix (ASC-US): Secondary | ICD-10-CM | POA: Diagnosis not present

## 2022-07-27 DIAGNOSIS — Z01419 Encounter for gynecological examination (general) (routine) without abnormal findings: Secondary | ICD-10-CM | POA: Diagnosis not present

## 2022-07-27 DIAGNOSIS — Z124 Encounter for screening for malignant neoplasm of cervix: Secondary | ICD-10-CM | POA: Diagnosis not present

## 2022-07-27 DIAGNOSIS — Z6823 Body mass index (BMI) 23.0-23.9, adult: Secondary | ICD-10-CM | POA: Diagnosis not present

## 2022-09-13 DIAGNOSIS — R87610 Atypical squamous cells of undetermined significance on cytologic smear of cervix (ASC-US): Secondary | ICD-10-CM | POA: Diagnosis not present

## 2022-09-13 DIAGNOSIS — R87810 Cervical high risk human papillomavirus (HPV) DNA test positive: Secondary | ICD-10-CM | POA: Diagnosis not present

## 2022-09-14 DIAGNOSIS — Z0000 Encounter for general adult medical examination without abnormal findings: Secondary | ICD-10-CM | POA: Diagnosis not present

## 2022-09-14 DIAGNOSIS — Z13220 Encounter for screening for lipoid disorders: Secondary | ICD-10-CM | POA: Diagnosis not present

## 2022-09-14 DIAGNOSIS — M797 Fibromyalgia: Secondary | ICD-10-CM | POA: Diagnosis not present

## 2022-09-14 DIAGNOSIS — R21 Rash and other nonspecific skin eruption: Secondary | ICD-10-CM | POA: Diagnosis not present

## 2022-09-14 DIAGNOSIS — F419 Anxiety disorder, unspecified: Secondary | ICD-10-CM | POA: Diagnosis not present

## 2022-09-14 DIAGNOSIS — F5101 Primary insomnia: Secondary | ICD-10-CM | POA: Diagnosis not present

## 2022-09-14 DIAGNOSIS — Z23 Encounter for immunization: Secondary | ICD-10-CM | POA: Diagnosis not present

## 2022-10-16 DIAGNOSIS — Z08 Encounter for follow-up examination after completed treatment for malignant neoplasm: Secondary | ICD-10-CM | POA: Diagnosis not present

## 2022-10-16 DIAGNOSIS — D492 Neoplasm of unspecified behavior of bone, soft tissue, and skin: Secondary | ICD-10-CM | POA: Diagnosis not present

## 2022-10-16 DIAGNOSIS — L821 Other seborrheic keratosis: Secondary | ICD-10-CM | POA: Diagnosis not present

## 2022-10-16 DIAGNOSIS — L570 Actinic keratosis: Secondary | ICD-10-CM | POA: Diagnosis not present

## 2022-10-16 DIAGNOSIS — L280 Lichen simplex chronicus: Secondary | ICD-10-CM | POA: Diagnosis not present

## 2022-10-16 DIAGNOSIS — L309 Dermatitis, unspecified: Secondary | ICD-10-CM | POA: Diagnosis not present

## 2022-10-16 DIAGNOSIS — L814 Other melanin hyperpigmentation: Secondary | ICD-10-CM | POA: Diagnosis not present

## 2022-10-16 DIAGNOSIS — D225 Melanocytic nevi of trunk: Secondary | ICD-10-CM | POA: Diagnosis not present

## 2022-10-23 DIAGNOSIS — L298 Other pruritus: Secondary | ICD-10-CM | POA: Diagnosis not present

## 2022-10-23 DIAGNOSIS — L281 Prurigo nodularis: Secondary | ICD-10-CM | POA: Diagnosis not present

## 2022-11-21 DIAGNOSIS — F5101 Primary insomnia: Secondary | ICD-10-CM | POA: Diagnosis not present

## 2022-11-21 DIAGNOSIS — H9313 Tinnitus, bilateral: Secondary | ICD-10-CM | POA: Diagnosis not present

## 2022-11-21 DIAGNOSIS — Z1321 Encounter for screening for nutritional disorder: Secondary | ICD-10-CM | POA: Diagnosis not present

## 2022-11-21 DIAGNOSIS — Z760 Encounter for issue of repeat prescription: Secondary | ICD-10-CM | POA: Diagnosis not present

## 2022-11-21 DIAGNOSIS — R0981 Nasal congestion: Secondary | ICD-10-CM | POA: Diagnosis not present

## 2022-11-27 DIAGNOSIS — H53143 Visual discomfort, bilateral: Secondary | ICD-10-CM | POA: Diagnosis not present

## 2022-12-04 DIAGNOSIS — L281 Prurigo nodularis: Secondary | ICD-10-CM | POA: Diagnosis not present

## 2022-12-04 DIAGNOSIS — L2089 Other atopic dermatitis: Secondary | ICD-10-CM | POA: Diagnosis not present

## 2022-12-13 DIAGNOSIS — H903 Sensorineural hearing loss, bilateral: Secondary | ICD-10-CM | POA: Diagnosis not present

## 2022-12-18 DIAGNOSIS — L2089 Other atopic dermatitis: Secondary | ICD-10-CM | POA: Diagnosis not present

## 2023-01-01 DIAGNOSIS — L2089 Other atopic dermatitis: Secondary | ICD-10-CM | POA: Diagnosis not present

## 2023-01-15 DIAGNOSIS — L2089 Other atopic dermatitis: Secondary | ICD-10-CM | POA: Diagnosis not present

## 2023-01-29 DIAGNOSIS — L2089 Other atopic dermatitis: Secondary | ICD-10-CM | POA: Diagnosis not present

## 2023-02-06 DIAGNOSIS — L281 Prurigo nodularis: Secondary | ICD-10-CM | POA: Diagnosis not present

## 2023-02-06 DIAGNOSIS — E559 Vitamin D deficiency, unspecified: Secondary | ICD-10-CM | POA: Diagnosis not present

## 2023-02-06 DIAGNOSIS — F419 Anxiety disorder, unspecified: Secondary | ICD-10-CM | POA: Diagnosis not present

## 2023-02-06 DIAGNOSIS — F5101 Primary insomnia: Secondary | ICD-10-CM | POA: Diagnosis not present

## 2023-02-12 DIAGNOSIS — L2089 Other atopic dermatitis: Secondary | ICD-10-CM | POA: Diagnosis not present

## 2023-02-25 DIAGNOSIS — L281 Prurigo nodularis: Secondary | ICD-10-CM | POA: Diagnosis not present

## 2023-04-05 DIAGNOSIS — Z1231 Encounter for screening mammogram for malignant neoplasm of breast: Secondary | ICD-10-CM | POA: Diagnosis not present

## 2023-04-29 DIAGNOSIS — L820 Inflamed seborrheic keratosis: Secondary | ICD-10-CM | POA: Diagnosis not present

## 2023-04-29 DIAGNOSIS — L281 Prurigo nodularis: Secondary | ICD-10-CM | POA: Diagnosis not present

## 2023-05-06 DIAGNOSIS — L2089 Other atopic dermatitis: Secondary | ICD-10-CM | POA: Diagnosis not present

## 2023-05-14 DIAGNOSIS — R0982 Postnasal drip: Secondary | ICD-10-CM | POA: Diagnosis not present

## 2023-05-14 DIAGNOSIS — L299 Pruritus, unspecified: Secondary | ICD-10-CM | POA: Diagnosis not present

## 2023-05-14 DIAGNOSIS — J3089 Other allergic rhinitis: Secondary | ICD-10-CM | POA: Diagnosis not present

## 2023-05-14 DIAGNOSIS — R519 Headache, unspecified: Secondary | ICD-10-CM | POA: Diagnosis not present

## 2023-05-14 DIAGNOSIS — R0981 Nasal congestion: Secondary | ICD-10-CM | POA: Diagnosis not present

## 2023-05-20 DIAGNOSIS — L2089 Other atopic dermatitis: Secondary | ICD-10-CM | POA: Diagnosis not present

## 2023-06-18 DIAGNOSIS — L2089 Other atopic dermatitis: Secondary | ICD-10-CM | POA: Diagnosis not present

## 2023-07-02 DIAGNOSIS — L2089 Other atopic dermatitis: Secondary | ICD-10-CM | POA: Diagnosis not present

## 2023-07-04 DIAGNOSIS — F5101 Primary insomnia: Secondary | ICD-10-CM | POA: Diagnosis not present

## 2023-07-04 DIAGNOSIS — F419 Anxiety disorder, unspecified: Secondary | ICD-10-CM | POA: Diagnosis not present

## 2023-07-04 DIAGNOSIS — Z23 Encounter for immunization: Secondary | ICD-10-CM | POA: Diagnosis not present

## 2023-08-02 DIAGNOSIS — Z01419 Encounter for gynecological examination (general) (routine) without abnormal findings: Secondary | ICD-10-CM | POA: Diagnosis not present

## 2023-08-02 DIAGNOSIS — Z8742 Personal history of other diseases of the female genital tract: Secondary | ICD-10-CM | POA: Diagnosis not present

## 2023-08-02 DIAGNOSIS — R87612 Low grade squamous intraepithelial lesion on cytologic smear of cervix (LGSIL): Secondary | ICD-10-CM | POA: Diagnosis not present

## 2023-08-27 DIAGNOSIS — L209 Atopic dermatitis, unspecified: Secondary | ICD-10-CM | POA: Diagnosis not present

## 2023-08-27 DIAGNOSIS — L2089 Other atopic dermatitis: Secondary | ICD-10-CM | POA: Diagnosis not present

## 2023-10-08 DIAGNOSIS — M25532 Pain in left wrist: Secondary | ICD-10-CM | POA: Diagnosis not present

## 2023-10-17 DIAGNOSIS — L570 Actinic keratosis: Secondary | ICD-10-CM | POA: Diagnosis not present

## 2023-10-17 DIAGNOSIS — L0101 Non-bullous impetigo: Secondary | ICD-10-CM | POA: Diagnosis not present

## 2023-10-17 DIAGNOSIS — L821 Other seborrheic keratosis: Secondary | ICD-10-CM | POA: Diagnosis not present

## 2023-10-17 DIAGNOSIS — D225 Melanocytic nevi of trunk: Secondary | ICD-10-CM | POA: Diagnosis not present

## 2023-10-17 DIAGNOSIS — L814 Other melanin hyperpigmentation: Secondary | ICD-10-CM | POA: Diagnosis not present

## 2023-10-24 DIAGNOSIS — E785 Hyperlipidemia, unspecified: Secondary | ICD-10-CM | POA: Diagnosis not present

## 2023-10-24 DIAGNOSIS — E559 Vitamin D deficiency, unspecified: Secondary | ICD-10-CM | POA: Diagnosis not present

## 2023-10-24 DIAGNOSIS — E2839 Other primary ovarian failure: Secondary | ICD-10-CM | POA: Diagnosis not present

## 2023-10-24 DIAGNOSIS — Z0000 Encounter for general adult medical examination without abnormal findings: Secondary | ICD-10-CM | POA: Diagnosis not present

## 2023-11-12 DIAGNOSIS — M25532 Pain in left wrist: Secondary | ICD-10-CM | POA: Diagnosis not present

## 2023-11-12 DIAGNOSIS — M67432 Ganglion, left wrist: Secondary | ICD-10-CM | POA: Diagnosis not present

## 2023-11-14 DIAGNOSIS — M8588 Other specified disorders of bone density and structure, other site: Secondary | ICD-10-CM | POA: Diagnosis not present

## 2023-11-14 DIAGNOSIS — E2839 Other primary ovarian failure: Secondary | ICD-10-CM | POA: Diagnosis not present

## 2023-11-14 DIAGNOSIS — N958 Other specified menopausal and perimenopausal disorders: Secondary | ICD-10-CM | POA: Diagnosis not present

## 2023-12-13 DIAGNOSIS — H903 Sensorineural hearing loss, bilateral: Secondary | ICD-10-CM | POA: Diagnosis not present

## 2024-02-26 DIAGNOSIS — L2089 Other atopic dermatitis: Secondary | ICD-10-CM | POA: Diagnosis not present

## 2024-02-26 DIAGNOSIS — R202 Paresthesia of skin: Secondary | ICD-10-CM | POA: Diagnosis not present

## 2024-04-10 DIAGNOSIS — Z1231 Encounter for screening mammogram for malignant neoplasm of breast: Secondary | ICD-10-CM | POA: Diagnosis not present

## 2024-04-12 DIAGNOSIS — W540XXA Bitten by dog, initial encounter: Secondary | ICD-10-CM | POA: Diagnosis not present

## 2024-04-12 DIAGNOSIS — S61255A Open bite of left ring finger without damage to nail, initial encounter: Secondary | ICD-10-CM | POA: Diagnosis not present

## 2024-04-12 DIAGNOSIS — S61452A Open bite of left hand, initial encounter: Secondary | ICD-10-CM | POA: Diagnosis not present

## 2024-04-15 DIAGNOSIS — F419 Anxiety disorder, unspecified: Secondary | ICD-10-CM | POA: Diagnosis not present

## 2024-04-15 DIAGNOSIS — F5101 Primary insomnia: Secondary | ICD-10-CM | POA: Diagnosis not present

## 2024-04-23 DIAGNOSIS — M779 Enthesopathy, unspecified: Secondary | ICD-10-CM | POA: Diagnosis not present

## 2024-04-23 DIAGNOSIS — M7702 Medial epicondylitis, left elbow: Secondary | ICD-10-CM | POA: Diagnosis not present

## 2024-04-23 DIAGNOSIS — Z4802 Encounter for removal of sutures: Secondary | ICD-10-CM | POA: Diagnosis not present

## 2024-04-23 DIAGNOSIS — M25522 Pain in left elbow: Secondary | ICD-10-CM | POA: Diagnosis not present

## 2024-04-23 DIAGNOSIS — M79632 Pain in left forearm: Secondary | ICD-10-CM | POA: Diagnosis not present

## 2024-04-28 DIAGNOSIS — M7702 Medial epicondylitis, left elbow: Secondary | ICD-10-CM | POA: Diagnosis not present

## 2024-06-04 DIAGNOSIS — D492 Neoplasm of unspecified behavior of bone, soft tissue, and skin: Secondary | ICD-10-CM | POA: Diagnosis not present

## 2024-06-04 DIAGNOSIS — L538 Other specified erythematous conditions: Secondary | ICD-10-CM | POA: Diagnosis not present

## 2024-06-04 DIAGNOSIS — L570 Actinic keratosis: Secondary | ICD-10-CM | POA: Diagnosis not present

## 2024-06-04 DIAGNOSIS — L578 Other skin changes due to chronic exposure to nonionizing radiation: Secondary | ICD-10-CM | POA: Diagnosis not present

## 2024-06-04 DIAGNOSIS — C4441 Basal cell carcinoma of skin of scalp and neck: Secondary | ICD-10-CM | POA: Diagnosis not present

## 2024-06-04 DIAGNOSIS — L820 Inflamed seborrheic keratosis: Secondary | ICD-10-CM | POA: Diagnosis not present

## 2024-06-04 DIAGNOSIS — C44719 Basal cell carcinoma of skin of left lower limb, including hip: Secondary | ICD-10-CM | POA: Diagnosis not present

## 2024-06-04 DIAGNOSIS — D0472 Carcinoma in situ of skin of left lower limb, including hip: Secondary | ICD-10-CM | POA: Diagnosis not present

## 2024-06-25 DIAGNOSIS — D0472 Carcinoma in situ of skin of left lower limb, including hip: Secondary | ICD-10-CM | POA: Diagnosis not present

## 2024-06-25 DIAGNOSIS — C44719 Basal cell carcinoma of skin of left lower limb, including hip: Secondary | ICD-10-CM | POA: Diagnosis not present

## 2024-07-02 DIAGNOSIS — Z4801 Encounter for change or removal of surgical wound dressing: Secondary | ICD-10-CM | POA: Diagnosis not present

## 2024-07-09 DIAGNOSIS — Z48817 Encounter for surgical aftercare following surgery on the skin and subcutaneous tissue: Secondary | ICD-10-CM | POA: Diagnosis not present

## 2024-07-09 DIAGNOSIS — L905 Scar conditions and fibrosis of skin: Secondary | ICD-10-CM | POA: Diagnosis not present

## 2024-07-09 DIAGNOSIS — C4441 Basal cell carcinoma of skin of scalp and neck: Secondary | ICD-10-CM | POA: Diagnosis not present

## 2024-07-10 DIAGNOSIS — M79632 Pain in left forearm: Secondary | ICD-10-CM | POA: Diagnosis not present

## 2024-07-10 DIAGNOSIS — M25522 Pain in left elbow: Secondary | ICD-10-CM | POA: Diagnosis not present

## 2024-08-05 DIAGNOSIS — L928 Other granulomatous disorders of the skin and subcutaneous tissue: Secondary | ICD-10-CM | POA: Diagnosis not present

## 2024-08-05 DIAGNOSIS — Z48817 Encounter for surgical aftercare following surgery on the skin and subcutaneous tissue: Secondary | ICD-10-CM | POA: Diagnosis not present

## 2024-08-25 DIAGNOSIS — Z48817 Encounter for surgical aftercare following surgery on the skin and subcutaneous tissue: Secondary | ICD-10-CM | POA: Diagnosis not present

## 2024-08-25 DIAGNOSIS — Z85828 Personal history of other malignant neoplasm of skin: Secondary | ICD-10-CM | POA: Diagnosis not present

## 2024-08-25 DIAGNOSIS — L570 Actinic keratosis: Secondary | ICD-10-CM | POA: Diagnosis not present

## 2024-09-01 DIAGNOSIS — R87611 Atypical squamous cells cannot exclude high grade squamous intraepithelial lesion on cytologic smear of cervix (ASC-H): Secondary | ICD-10-CM | POA: Diagnosis not present

## 2024-09-01 DIAGNOSIS — Z8742 Personal history of other diseases of the female genital tract: Secondary | ICD-10-CM | POA: Diagnosis not present

## 2024-09-01 DIAGNOSIS — Z01419 Encounter for gynecological examination (general) (routine) without abnormal findings: Secondary | ICD-10-CM | POA: Diagnosis not present
